# Patient Record
Sex: Female | Born: 1995 | Race: Black or African American | Hispanic: No | Smoking: Never smoker
Health system: Southern US, Community
[De-identification: ages and names within clinical notes are randomized; demographics above are authoritative.]

## PROBLEM LIST (undated history)

## (undated) DIAGNOSIS — D649 Anemia, unspecified: Secondary | ICD-10-CM

## (undated) DIAGNOSIS — T7840XA Allergy, unspecified, initial encounter: Secondary | ICD-10-CM

## (undated) HISTORY — DX: Allergy, unspecified, initial encounter: T78.40XA

## (undated) HISTORY — DX: Anemia, unspecified: D64.9

---
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
## 2017-05-26 ENCOUNTER — Encounter: Payer: Self-pay | Admitting: Family Medicine

## 2017-05-26 ENCOUNTER — Ambulatory Visit: Payer: BC Managed Care – PPO | Admitting: Family Medicine

## 2017-05-26 ENCOUNTER — Other Ambulatory Visit: Payer: Self-pay

## 2017-05-26 ENCOUNTER — Ambulatory Visit (INDEPENDENT_AMBULATORY_CARE_PROVIDER_SITE_OTHER): Payer: BC Managed Care – PPO

## 2017-05-26 VITALS — BP 118/60 | HR 110 | Temp 100.7°F | Resp 16 | Ht 66.0 in | Wt 139.2 lb

## 2017-05-26 DIAGNOSIS — R52 Pain, unspecified: Secondary | ICD-10-CM | POA: Diagnosis not present

## 2017-05-26 DIAGNOSIS — J111 Influenza due to unidentified influenza virus with other respiratory manifestations: Secondary | ICD-10-CM

## 2017-05-26 LAB — POC INFLUENZA A&B (BINAX/QUICKVUE)
Influenza A, POC: POSITIVE — AB
Influenza B, POC: NEGATIVE

## 2017-05-26 MED ORDER — CODEINE POLT-CHLORPHEN POLT ER 14.7-2.8 MG/5ML PO SUER: 10.0000 mL | Freq: Two times a day (BID) | ORAL | 0 refills | Status: DC | PRN

## 2017-05-26 MED ORDER — ALBUTEROL SULFATE (2.5 MG/3ML) 0.083% IN NEBU
2.5000 mg | INHALATION_SOLUTION | Freq: Once | RESPIRATORY_TRACT | Status: AC
  Administered 2017-05-26: 2.5 mg via RESPIRATORY_TRACT

## 2017-05-26 MED ORDER — BALOXAVIR MARBOXIL(40 MG DOSE) 2 X 20 MG PO TBPK: 40.0000 mg | ORAL_TABLET | Freq: Once | ORAL | 0 refills | Status: AC

## 2017-05-26 NOTE — Progress Notes (Signed)
Subjective:    Patient ID: Jacqueline Mueller, female    DOB: 1995/04/13, 22 y.o.   MRN: [id number] Chief Complaint  Patient presents with  . Influenza    HA, body aches, fever, hot/cold chills started Saturday with a sore throat     HPI Jacqueline Mueller is a delightful 22 yo woman here today with 1-2d of sudden and rapid onset acute illness.  She is a Theatre stage managernursing student at [employer] and had her clinicals for today and tomorrow - she showed up but they sent her home.  Sat - 2d ago - she felt ok - just a little scratchy throat.  Then that night the illness hit her hard while she ws sleeping. Throat became much more sore, sweats, chills, myalgias, nasal congestion, wheezing, SHoB, cough, fatigue, HA.  Waking freq throughout the night. No appetite. She knows she is dehydrated and does feel dizzy, presyncopal.  Home treatments tried inc Tylenol for fever, rest, fluids.  Did get flu shot this yr. No h/o asthma, smoking, or any respiratory problems. However, 1 mo ago she was diagnosed w/ bronchitis and was rx'd an inhaler which she still has at home. Has not used.  Past Medical History:  Diagnosis Date  . Allergy   . Anemia    Past Surgical History:  Procedure Laterality Date  . KNEE ARTHROPLASTY Right 2012   No current outpatient medications on file prior to visit.   No current facility-administered medications on file prior to visit.    Allergies no known allergies No family history on file. Social History   Socioeconomic History  . Marital status: Unknown    Spouse name: Not on file  . Number of children: Not on file  . Years of education: Not on file  . Highest education level: Not on file  Social Needs  . Financial resource strain: Not on file  . Food insecurity - worry: Not on file  . Food insecurity - inability: Not on file  . Transportation needs - medical: Not on file  . Transportation needs - non-medical: Not on file  Occupational History  . Not on file  Tobacco Use  . Smoking status: Not  on file  Substance and Sexual Activity  . Alcohol use: Not on file  . Drug use: Not on file  . Sexual activity: Not on file  Other Topics Concern  . Not on file  Social History Narrative  . Not on file   [facility] 05/26/2017  Decreased Interest 0  Down, Depressed, Hopeless 0  PHQ - 2 Score 0     Review of Systems See hpi    Objective:   Physical Exam  Constitutional: She is oriented to person, place, and time. She appears well-developed and well-nourished. No distress.  HENT:  Head: Normocephalic and atraumatic.  Right Ear: Tympanic membrane, external ear and ear canal normal.  Left Ear: Tympanic membrane, external ear and ear canal normal.  Nose: Rhinorrhea present. No mucosal edema.  Mouth/Throat: Uvula is midline, oropharynx is clear and moist and mucous membranes are normal. No oropharyngeal exudate, posterior oropharyngeal edema or posterior oropharyngeal erythema.  Eyes: Conjunctivae are normal. Right eye exhibits no discharge. Left eye exhibits no discharge. No scleral icterus.  Neck: Normal range of motion. Neck supple.  Cardiovascular: Normal rate, regular rhythm, normal heart sounds and intact distal pulses.  Pulmonary/Chest: Effort normal. She has wheezes (exp RUF much worse and RLF and persist in both anterior and  posterior) in the right upper  field and the right lower field.  After albuterol neb, improved air movement, softer end expiratory wheezing throughout all lungs worse in B upper lobes.  Lymphadenopathy:       Head (right side): No submandibular and no tonsillar adenopathy present.       Head (left side): No submandibular and no tonsillar adenopathy present.    She has cervical adenopathy.       Right cervical: Superficial cervical adenopathy present.       Left cervical: Superficial cervical adenopathy present.       Right: No supraclavicular adenopathy present.       Left: No supraclavicular adenopathy present.  Neurological: She is  alert and oriented to person, place, and time.  Skin: Skin is warm and dry. She is not diaphoretic. No erythema.  Psychiatric: She has a normal mood and affect. Her behavior is normal.      BP 118/60   Pulse (!) 110   Temp (!) 100.7 F (38.2 C)   Resp 16   Ht 5\' 6"  (1.676 m)   Wt 139 lb 3.2 oz (63.1 kg)   SpO2 100%   BMI 22.47 kg/m      Results for orders placed or performed in visit on 05/26/17  POC Influenza A&B(BINAX/QUICKVUE)  Result Value Ref Range   Influenza A, POC Positive (A) Negative   Influenza B, POC Negative Negative   Dg Chest 2 View  Result Date: 05/26/2017 CLINICAL DATA:  22 year old female with expiratory wheezes. Flu like symptoms for 2 days. EXAM: CHEST  2 VIEW COMPARISON:  None. FINDINGS: Lung volumes are compatible with good inspiratory effort. Mediastinal contours are within normal limits. Visualized tracheal air column is within normal limits. No pneumothorax, pulmonary edema, pleural effusion or abnormal pulmonary opacity. No osseous abnormality identified. Negative visible bowel gas pattern. IMPRESSION: Negative.  No acute cardiopulmonary abnormality. Electronically Signed   By: Odessa Fleming M.D.   On: 05/26/2017 14:50    Assessment & Plan:   1. Influenza with respiratory manifestation   2. Body aches   Pt did have some improvement with albuterol neb but was still wheezing - poss secondary to recent bronchitis last mo - advised using albuterol inhaler she was rx'd for the bronchitis sched qid and prn. Pulm toileting with breathing exercises.  RTC if [facility], wheezing, dyspnea, etc progress - at high risk for secondary bacterial infxn - pt aware.   Orders Placed This Encounter  Procedures  . DG Chest 2 View    Standing Status:   Future    Number of Occurrences:   1    Standing Expiration Date:   05/26/2018    Order Specific Question:   Reason for Exam (SYMPTOM  OR DIAGNOSIS REQUIRED)    Answer:   RUF exp wheeze. + influenza x 2d, h/o bronchitis 1 mo prev     Order Specific Question:   Is the patient pregnant?    Answer:   No    Order Specific Question:   Preferred imaging location?    Answer:   External  . POC Influenza A&B(BINAX/QUICKVUE)    Meds ordered this encounter  Medications  . Baloxavir Marboxil 40 MG Dose (XOFLUZA) 20 (2) MG TBPK    Sig: Take 40 mg by mouth once for 1 dose.    Dispense:  2 each    Refill:  0  . albuterol (PROVENTIL) (2.5 MG/3ML) 0.083% nebulizer solution 2.5 mg  . Codeine Polt-Chlorphen Polt ER (TUZISTRA XR) 14.7-2.8 MG/5ML SUER  Sig: Take 10 mLs by mouth every 12 (twelve) hours as needed.    Dispense:  120 mL    Refill:  0    Norberto Sorenson, M.D.  Primary Care at [facility] [address] [place], [place] [address] [phone] phone [phone] fax  05/28/17 12:49 AM

## 2017-05-26 NOTE — Patient Instructions (Addendum)
Influenza, Adult Influenza ("the flu") is an infection in the lungs, nose, and throat (respiratory tract). It is caused by a virus. The flu causes many common cold symptoms, as well as a high fever and body aches. It can make you feel very sick. The flu spreads easily from person to person (is contagious). Getting a flu shot (influenza vaccination) every year is the best way to prevent the flu. Follow these instructions at home:  Take over-the-counter and prescription medicines only as told by your doctor.  Use a cool mist humidifier to add moisture (humidity) to the air in your home. This can make it easier to breathe.  Rest as needed.  Drink enough fluid to keep your pee (urine) clear or pale yellow.  Cover your mouth and nose when you cough or sneeze.  Wash your hands with soap and water often, especially after you cough or sneeze. If you cannot use soap and water, use hand sanitizer.  Stay home from work or school as told by your doctor. Unless you are visiting your doctor, try to avoid leaving home until your fever has been gone for 24 hours without the use of medicine.  Keep all follow-up visits as told by your doctor. This is important. How is this prevented?  Getting a yearly (annual) flu shot is the best way to avoid getting the flu. You may get the flu shot in late summer, fall, or winter. Ask your doctor when you should get your flu shot.  Wash your hands often or use hand sanitizer often.  Avoid contact with people who are sick during cold and flu season.  Eat healthy foods.  Drink plenty of fluids.  Get enough sleep.  Exercise regularly. Contact a doctor if:  You get new symptoms.  You have: ? Chest pain. ? Watery poop (diarrhea). ? A fever.  Your cough gets worse.  You start to have more mucus.  You feel sick to your stomach (nauseous).  You throw up (vomit). Get help right away if:  You start to be short of breath or have trouble breathing.  Your  skin or nails turn a bluish color.  You have very bad pain or stiffness in your neck.  You get a sudden headache.  You get sudden pain in your face or ear.  You cannot stop throwing up. This information is not intended to replace advice given to you by your health care provider. Make sure you discuss any questions you have with your health care provider. Document Released: 12/26/2007 Document Revised: 08/24/2015 Document Reviewed: 01/10/2015 Elsevier Interactive Patient Education  2017 [employer]     IF you received an x-ray today, you will receive an invoice from [facility]. Please contact [facility] at [phone] with questions or concerns regarding your invoice.   IF you received labwork today, you will receive an invoice from [facility]. Please contact [facility] at [phone] with questions or concerns regarding your invoice.   Our billing staff will not be able to assist you with questions regarding bills from these companies.  You will be contacted with the lab results as soon as they are available. The fastest way to get your results is to activate your My Chart account. Instructions are located on the last page of this paperwork. If you have not heard from us regarding the results in 2 weeks, please contact this office.      Influenza, Adult Influenza, more commonly known as "the flu," is a viral infection that primarily  affects the respiratory tract. The respiratory tract includes organs that help you breathe, such as the lungs, nose, and throat. The flu causes many common cold symptoms, as well as a high fever and body aches. The flu spreads easily from person to person (is contagious). Getting a flu shot (influenza vaccination) every year is the best way to prevent influenza. What are the causes? Influenza is caused by a virus. You can catch the virus by:  Breathing in droplets from an infected person's cough or sneeze.  Touching something that  was recently contaminated with the virus and then touching your mouth, nose, or eyes.  What increases the risk? The following factors may make you more likely to get the flu:  Not cleaning your hands frequently with soap and water or alcohol-based hand sanitizer.  Having close contact with many people during cold and flu season.  Touching your mouth, eyes, or nose without washing or sanitizing your hands first.  Not drinking enough fluids or not eating a healthy diet.  Not getting enough sleep or exercise.  Being under a high amount of stress.  Not getting a yearly (annual) flu shot.  You may be at a higher risk of complications from the flu, such as a severe lung infection (pneumonia), if you:  Are over the age of 79.  Are pregnant.  Have a weakened disease-fighting system (immune system). You may have a weakened immune system if you: ? Have HIV or AIDS. ? Are undergoing chemotherapy. ? Aretaking medicines that reduce the activity of (suppress) the immune system.  Have a long-term (chronic) illness, such as heart disease, kidney disease, diabetes, or lung disease.  Have a liver disorder.  Are obese.  Have anemia.  What are the signs or symptoms? Symptoms of this condition typically last 4-10 days and may include:  Fever.  Chills.  Headache, body aches, or muscle aches.  Sore throat.  Cough.  Runny or congested nose.  Chest discomfort and cough.  Poor appetite.  Weakness or tiredness (fatigue).  Dizziness.  Nausea or vomiting.  How is this diagnosed? This condition may be diagnosed based on your medical history and a physical exam. Your health care provider may do a nose or throat swab test to confirm the diagnosis. How is this treated? If influenza is detected early, you can be treated with antiviral medicine that can reduce the length of your illness and the severity of your symptoms. This medicine may be given by mouth (orally) or through an IV  tube that is inserted in one of your veins. The goal of treatment is to relieve symptoms by taking care of yourself at home. This may include taking over-the-counter medicines, drinking plenty of fluids, and adding humidity to the air in your home. In some cases, influenza goes away on its own. Severe influenza or complications from influenza may be treated in a hospital. Follow these instructions at home:  Take over-the-counter and prescription medicines only as told by your health care provider.  Use a cool mist humidifier to add humidity to the air in your home. This can make breathing easier.  Rest as needed.  Drink enough fluid to keep your urine clear or pale yellow.  Cover your mouth and nose when you cough or sneeze.  Wash your hands with soap and water often, especially after you cough or sneeze. If soap and water are not available, use hand sanitizer.  Stay home from work or school as told by your health care provider.  Unless you are visiting your health care provider, try to avoid leaving home until your fever has been gone for 24 hours without the use of medicine.  Keep all follow-up visits as told by your health care provider. This is important. How is this prevented?  Getting an annual flu shot is the best way to avoid getting the flu. You may get the flu shot in late summer, fall, or winter. Ask your health care provider when you should get your flu shot.  Wash your hands often or use hand sanitizer often.  Avoid contact with people who are sick during cold and flu season.  Eat a healthy diet, drink plenty of fluids, get enough sleep, and exercise regularly. Contact a health care provider if:  You develop new symptoms.  You have: ? Chest pain. ? Diarrhea. ? A fever.  Your cough gets worse.  You produce more mucus.  You feel nauseous or you vomit. Get help right away if:  You develop shortness of breath or difficulty breathing.  Your skin or nails turn a  bluish color.  You have severe pain or stiffness in your neck.  You develop a sudden headache or sudden pain in your face or ear.  You cannot stop vomiting. This information is not intended to replace advice given to you by your health care provider. Make sure you discuss any questions you have with your health care provider. Document Released: 03/15/2000 Document Revised: 08/24/2015 Document Reviewed: 01/10/2015 Elsevier Interactive Patient Education  2017 Elsevier Inc.  Bronchospasm, Adult Bronchospasm is a tightening of the airways going into the lungs. During an episode, it may be harder to breathe. You may cough, and you may make a whistling sound when you breathe (wheeze). This condition often affects people with asthma. What are the causes? This condition is caused by swelling and irritation in the airways. It can be triggered by:  An infection (common).  Seasonal allergies.  An allergic reaction.  Exercise.  Irritants. These include pollution, cigarette smoke, strong odors, aerosol sprays, and paint fumes.  Weather changes. Winds increase molds and pollens in the air. Cold air may cause swelling.  Stress and emotional upset.  What are the signs or symptoms? Symptoms of this condition include:  Wheezing. If the episode was triggered by an allergy, wheezing may start right away or hours later.  Nighttime coughing.  Frequent or severe coughing with a simple cold.  Chest tightness.  Shortness of breath.  Decreased ability to exercise.  How is this diagnosed? This condition is usually diagnosed with a review of your medical history and a physical exam. Tests, such as lung function tests, are sometimes done to look for other conditions. The need for a chest X-ray depends on where the wheezing occurs and whether it is the first time you have wheezed. How is this treated? This condition may be treated with:  Inhaled medicines. These open up the airways and help you  breathe. They can be taken with an inhaler or a nebulizer device.  Corticosteroid medicines. These may be given for severe bronchospasm, usually when it is associated with asthma.  Avoiding triggers, such as irritants, infection, or allergies.  Follow these instructions at home: Medicines  Take over-the-counter and prescription medicines only as told by your health care provider.  If you need to use an inhaler or nebulizer to take your medicine, ask your health care provider to explain how to use it correctly. If you were given a spacer, always use it  with your inhaler. Lifestyle  Reduce the number of triggers in your home. To do this: ? Change your heating and air conditioning filter at least once a month. ? Limit your use of fireplaces and wood stoves. ? Do not smoke. Do not allow smoking in your home. ? Avoid using perfumes and fragrances. ? Get rid of pests, such as roaches and mice, and their droppings. ? Remove any mold from your home. ? Keep your house clean and dust free. Use unscented cleaning products. ? Replace carpet with wood, tile, or vinyl flooring. Carpet can trap dander and dust. ? Use allergy-proof pillows, mattress covers, and box spring covers. ? Wash bed sheets and blankets every week in hot water. Dry them in a dryer. ? Use blankets that are made of polyester or cotton. ? Wash your hands often. ? Do not allow pets in your bedroom.  Avoid breathing in cold air when you exercise. General instructions  Have a plan for seeking medical care. Know when to call your health care provider and local emergency services, and where to get emergency care.  Stay up to date on your immunizations.  When you have an episode of bronchospasm, stay calm. Try to relax and breathe more slowly.  If you have asthma, make sure you have an asthma action plan.  Keep all follow-up visits as told by your health care provider. This is important. Contact a health care provider  if:  You have muscle aches.  You have chest pain.  The mucus that you cough up (sputum) changes from clear or white to yellow, green, gray, or bloody.  You have a fever.  Your sputum gets thicker. Get help right away if:  Your wheezing and coughing get worse, even after you take your prescribed medicines.  It gets even harder to breathe.  You develop severe chest pain. Summary  Bronchospasm is a tightening of the airways going into the lungs.  During an episode of bronchospasm, you may have a harder time breathing. You may cough and make a whistling sound when you breathe (wheeze).  Avoid exposure to triggers such as smoke, dust, mold, animal dander, and fragrances.  When you have an episode of bronchospasm, stay calm. Try to relax and breathe more slowly. This information is not intended to replace advice given to you by your health care provider. Make sure you discuss any questions you have with your health care provider. Document Released: 03/21/2003 Document Revised: 03/14/2016 Document Reviewed: 03/14/2016 Elsevier Interactive Patient Education  2017 [employer].

## 2017-07-14 ENCOUNTER — Ambulatory Visit (INDEPENDENT_AMBULATORY_CARE_PROVIDER_SITE_OTHER): Payer: BC Managed Care – PPO

## 2017-07-14 ENCOUNTER — Ambulatory Visit: Payer: Self-pay | Admitting: *Deleted

## 2017-07-14 ENCOUNTER — Encounter: Payer: Self-pay | Admitting: Physician Assistant

## 2017-07-14 ENCOUNTER — Ambulatory Visit: Payer: BC Managed Care – PPO | Admitting: Physician Assistant

## 2017-07-14 VITALS — BP 111/86 | HR 104 | Temp 99.0°F | Resp 20

## 2017-07-14 DIAGNOSIS — J22 Unspecified acute lower respiratory infection: Secondary | ICD-10-CM

## 2017-07-14 DIAGNOSIS — R062 Wheezing: Secondary | ICD-10-CM

## 2017-07-14 DIAGNOSIS — R058 Other specified cough: Secondary | ICD-10-CM

## 2017-07-14 DIAGNOSIS — R0602 Shortness of breath: Secondary | ICD-10-CM

## 2017-07-14 DIAGNOSIS — R05 Cough: Secondary | ICD-10-CM

## 2017-07-14 DIAGNOSIS — D509 Iron deficiency anemia, unspecified: Secondary | ICD-10-CM

## 2017-07-14 LAB — POCT CBC
GRANULOCYTE PERCENT: 64.7 % (ref 37–80)
HCT, POC: 31.5 % — AB (ref 37.7–47.9)
Hemoglobin: 9.5 g/dL — AB (ref 12.2–16.2)
Lymph, poc: 2.7 (ref 0.6–3.4)
MCH, POC: 23 pg — AB (ref 27–31.2)
MCHC: 30.2 g/dL — AB (ref 31.8–35.4)
MCV: 76 fL — AB (ref 80–97)
MID (cbc): 0.8 (ref 0–0.9)
MPV: 7.3 fL (ref 0–99.8)
PLATELET COUNT, POC: 376 10*3/uL (ref 142–424)
POC Granulocyte: 6.5 (ref 2–6.9)
POC LYMPH %: 27.4 % (ref 10–50)
POC MID %: 7.9 %M (ref 0–12)
RBC: 4.14 M/uL (ref 4.04–5.48)
RDW, POC: 21.6 %
WBC: 10 10*3/uL (ref 4.6–10.2)

## 2017-07-14 LAB — POC INFLUENZA A&B (BINAX/QUICKVUE)
INFLUENZA A, POC: NEGATIVE
Influenza B, POC: NEGATIVE

## 2017-07-14 MED ORDER — ALBUTEROL SULFATE (2.5 MG/3ML) 0.083% IN NEBU
2.5000 mg | INHALATION_SOLUTION | Freq: Once | RESPIRATORY_TRACT | Status: AC
  Administered 2017-07-14: 2.5 mg via RESPIRATORY_TRACT

## 2017-07-14 MED ORDER — FERROUS SULFATE 324 (65 FE) MG PO TBEC: 1.0000 | DELAYED_RELEASE_TABLET | ORAL | 2 refills | Status: AC

## 2017-07-14 MED ORDER — METHYLPREDNISOLONE SODIUM SUCC 125 MG IJ SOLR
125.0000 mg | Freq: Once | INTRAMUSCULAR | Status: AC
  Administered 2017-07-14: 125 mg via INTRAVENOUS

## 2017-07-14 MED ORDER — AZITHROMYCIN 250 MG PO TABS: ORAL_TABLET | ORAL | 0 refills | Status: DC

## 2017-07-14 MED ORDER — IPRATROPIUM BROMIDE 0.02 % IN SOLN
0.5000 mg | Freq: Once | RESPIRATORY_TRACT | Status: AC
  Administered 2017-07-14: 0.5 mg via RESPIRATORY_TRACT

## 2017-07-14 MED ORDER — SODIUM CHLORIDE 0.9 % IV BOLUS
1000.0000 mL | Freq: Once | INTRAVENOUS | Status: AC
  Administered 2017-07-14: 1000 mL via INTRAVENOUS

## 2017-07-14 MED ORDER — PREDNISONE 20 MG PO TABS: ORAL_TABLET | ORAL | 0 refills | Status: DC

## 2017-07-14 MED ORDER — GUAIFENESIN ER 1200 MG PO TB12: 1.0000 | ORAL_TABLET | Freq: Two times a day (BID) | ORAL | 1 refills | Status: DC | PRN

## 2017-07-14 NOTE — Telephone Encounter (Signed)
Pt reports mild SOB x 3 days, productive cough for "yellowish sputum" sore throat, congestion and dizziness. Worsening symptoms this AM. Audible wheezing present. States dizziness is positional, worse sitting to standing. Seen by Dr. Clelia CroftShaw 05/26/17 for similar symptoms, Pos.type A flu. . States using albuterol inhaler, minimally effective. Denies fever.Reports chest "tightness" does not radiate. SOB worsens with exertion; NT can hear wheezing.  Directed to UC. Care advise given per protocol.  Reason for Disposition . [1] MILD difficulty breathing (e.g., minimal/no SOB at rest, SOB with walking, pulse <100) AND [2] NEW-onset or WORSE than normal  Answer Assessment - Initial Assessment Questions 1. RESPIRATORY STATUS: "Describe your breathing?" (e.g., wheezing, shortness of breath, unable to speak, severe coughing)      Wheezing is audible 2. ONSET: "When did this breathing problem begin?"      3 days ago 3. PATTERN "Does the difficult breathing come and go, or has it been constant since it started?"      Constant, worse with exertion 4. SEVERITY: "How bad is your breathing?" (e.g., mild, moderate, severe)    - MILD: No SOB at rest, mild SOB with walking, speaks normally in sentences, can lay down, no retractions, pulse < 100.    - MODERATE: SOB at rest, SOB with minimal exertion and prefers to sit, cannot lie down flat, speaks in phrases, mild retractions, audible wheezing, pulse 100-120.    - SEVERE: Very SOB at rest, speaks in single words, struggling to breathe, sitting hunched forward, retractions, pulse > 120      Mild to moderate with exertion 5. RECURRENT SYMPTOM: "Have you had difficulty breathing before?" If so, ask: "When was the last time?" and "What happened that time?"      05/26/17, bronchitis, flu 6. CARDIAC HISTORY: "Do you have any history of heart disease?" (e.g., heart attack, angina, bypass surgery, angioplasty)      no 7. LUNG HISTORY: "Do you have any history of lung disease?"   (e.g., pulmonary embolus, asthma, emphysema)     no 8. CAUSE: "What do you think is causing the breathing problem?"      Not sure 9. OTHER SYMPTOMS: "Do you have any other symptoms? (e.g., dizziness, runny nose, cough, chest pain, fever)     Congestion, dizziness, sore throat, productive cough, chest tightness. 10. PREGNANCY: "Is there any chance you are pregnant?" "When was your last menstrual period?"       No 11. TRAVEL: "Have you traveled out of the country in the last month?" (e.g., travel history, exposures)       no  Protocols used: BREATHING DIFFICULTY-A-AH

## 2017-07-14 NOTE — Progress Notes (Signed)
PRIMARY CARE AT [facility] [address] [place] [address] [phone]  Date:  07/14/2017   Name:  Jacqueline Mueller   DOB:  April 11, 1995   MRN:  [id number]  PCP:  Patient, No Pcp Per    History of Present Illness:  Jacqueline Mueller is a 22 y.o. female patient who presents to PCP with  Chief Complaint  Patient presents with  . Shortness of Breath    expiratory and inspiartory wheezing x2 days with flu like symptoms beginning a week ago     Patient is here for cc of 2 days of fever and chills.  Body aches.  Developed coughing and wheezing. She has had minimal nasal congestion.   She has not taken her temperature. 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 She is not a smoker.  No history of asthma.   She has had some dizziness and trouble breathing.   Hx of bronchitis with use of inhaler. Following blood work, does claim history of anemia secondary to menses.  Generally takes iron but stopped due to gi symptoms.    Results for orders placed or performed in visit on 07/14/17  POC Influenza A&B(BINAX/QUICKVUE)  Result Value Ref Range   Influenza A, POC Negative Negative   Influenza B, POC Negative Negative  POCT CBC  Result Value Ref Range   WBC 10.0 4.6 - 10.2 K/uL   Lymph, poc 2.7 0.6 - 3.4   POC LYMPH PERCENT 27.4 10 - 50 %L   MID (cbc) 0.8 0 - 0.9   POC MID % 7.9 0 - 12 %M   POC Granulocyte 6.5 2 - 6.9   Granulocyte percent 64.7 37 - 80 %G   RBC 4.14 4.04 - 5.48 M/uL   Hemoglobin 9.5 (A) 12.2 - 16.2 g/dL   HCT, POC 57.8 (A) 46.9 - 47.9 %   MCV 76.0 (A) 80 - 97 fL   MCH, POC 23.0 (A) 27 - 31.2 pg   MCHC 30.2 (A) 31.8 - 35.4 g/dL   RDW, POC 62.9 %   Platelet Count, POC 376 142 - 424 K/uL   MPV 7.3 0 - 99.8 fL    There are no active problems to display for this patient.   Past Medical History:  Diagnosis Date  . Allergy   . Anemia     Past Surgical History:  Procedure Laterality Date  . KNEE ARTHROPLASTY Right 2012    Social History   Tobacco Use  .  Smoking status: Never Smoker  . Smokeless tobacco: Never Used  Substance Use Topics  . Alcohol use: Not on file  . Drug use: Not on file    History reviewed. No pertinent family history.  No Known Allergies  Medication list has been reviewed and updated.  Current Outpatient Medications on File Prior to Visit  Medication Sig Dispense Refill  . Codeine Polt-Chlorphen Polt ER (TUZISTRA XR) 14.7-2.8 MG/5ML SUER Take 10 mLs by mouth every 12 (twelve) hours as needed. (Patient not taking: Reported on 07/14/2017) 120 mL 0   No current facility-administered medications on file prior to visit.     ROS ROS otherwise unremarkable unless listed above.  Physical Examination: BP 111/86   Pulse (!) 104   Temp 99 F (37.2 C) (Oral)   Resp 20  Ideal Body Weight:    Physical Exam  Constitutional: She is oriented to person, place, and time. She appears well-developed and well-nourished. No distress.  HENT:  Head: Normocephalic and atraumatic.  Right Ear:  Tympanic membrane, external ear and ear canal normal.  Left Ear: Tympanic membrane, external ear and ear canal normal.  Nose: Mucosal edema and rhinorrhea present. Right sinus exhibits no maxillary sinus tenderness and no frontal sinus tenderness. Left sinus exhibits no maxillary sinus tenderness and no frontal sinus tenderness.  Mouth/Throat: No uvula swelling. No oropharyngeal exudate, posterior oropharyngeal edema or posterior oropharyngeal erythema.  Eyes: Pupils are equal, round, and reactive to light. Conjunctivae and EOM are normal.  Cardiovascular: Normal rate and regular rhythm. Exam reveals no gallop, no distant heart sounds and no friction rub.  No murmur heard. Pulmonary/Chest: No accessory muscle usage. No respiratory distress. She has decreased breath sounds. She has wheezes. She has no rhonchi.  Lymphadenopathy:       Head (right side): No submandibular, no tonsillar, no preauricular and no posterior auricular adenopathy  present.       Head (left side): No submandibular, no tonsillar, no preauricular and no posterior auricular adenopathy present.  Neurological: She is alert and oriented to person, place, and time.  Skin: She is not diaphoretic.  Psychiatric: She has a normal mood and affect. Her behavior is normal.   Results for orders placed or performed in visit on 07/14/17  Iron, TIBC and Ferritin Panel  Result Value Ref Range   Total Iron Binding Capacity 443 250 - 450 ug/dL   UIBC 782423 956131 - 213425 ug/dL   Iron 20 (L) 27 - 086159 ug/dL   Iron Saturation 5 (LL) 15 - 55 %   Ferritin 10 (L) 15 - 150 ng/mL  POC Influenza A&B(BINAX/QUICKVUE)  Result Value Ref Range   Influenza A, POC Negative Negative   Influenza B, POC Negative Negative  POCT CBC  Result Value Ref Range   WBC 10.0 4.6 - 10.2 K/uL   Lymph, poc 2.7 0.6 - 3.4   POC LYMPH PERCENT 27.4 10 - 50 %L   MID (cbc) 0.8 0 - 0.9   POC MID % 7.9 0 - 12 %M   POC Granulocyte 6.5 2 - 6.9   Granulocyte percent 64.7 37 - 80 %G   RBC 4.14 4.04 - 5.48 M/uL   Hemoglobin 9.5 (A) 12.2 - 16.2 g/dL   HCT, POC 57.831.5 (A) 46.937.7 - 47.9 %   MCV 76.0 (A) 80 - 97 fL   MCH, POC 23.0 (A) 27 - 31.2 pg   MCHC 30.2 (A) 31.8 - 35.4 g/dL   RDW, POC 62.921.6 %   Platelet Count, POC 376 142 - 424 K/uL   MPV 7.3 0 - 99.8 fL      Assessment and Plan: Jacqueline Mueller is a 22 y.o. female who is here today for cc of  Chief Complaint  Patient presents with  . Shortness of Breath    expiratory and inspiartory wheezing x2 days with flu like symptoms beginning a week ago  after 2 nebulizing treatment, some improvement of symptoms.   Given solumedrol Advised to start prednisone taper the next day.   Advised of alarming symptoms to warrant an immediate return or to the ED Advised to follow up in 3 days with McVey, pa-c Also advised to follow up additionally in 2 weeks to recheck cbc.  Lower respiratory infection (e.g., bronchitis, pneumonia, pneumonitis, pulmonitis) - Plan:  azithromycin (ZITHROMAX) 250 MG tablet, predniSONE (DELTASONE) 20 MG tablet, Guaifenesin (MUCINEX MAXIMUM STRENGTH) 1200 MG TB12  Shortness of breath - Plan: albuterol (PROVENTIL) (2.5 MG/3ML) 0.083% nebulizer solution 2.5 mg, ipratropium (ATROVENT) nebulizer solution 0.5 mg, POC  Influenza A&B(BINAX/QUICKVUE), DG Chest 2 View, albuterol (PROVENTIL) (2.5 MG/3ML) 0.083% nebulizer solution 2.5 mg, POCT CBC, sodium chloride 0.9 % bolus 1,000 mL, methylPREDNISolone sodium succinate (SOLU-MEDROL) 125 mg/2 mL injection 125 mg, azithromycin (ZITHROMAX) 250 MG tablet, predniSONE (DELTASONE) 20 MG tablet, Guaifenesin (MUCINEX MAXIMUM STRENGTH) 1200 MG TB12  Wheezing - Plan: DG Chest 2 View, albuterol (PROVENTIL) (2.5 MG/3ML) 0.083% nebulizer solution 2.5 mg, POCT CBC, sodium chloride 0.9 % bolus 1,000 mL, methylPREDNISolone sodium succinate (SOLU-MEDROL) 125 mg/2 mL injection 125 mg, azithromycin (ZITHROMAX) 250 MG tablet, predniSONE (DELTASONE) 20 MG tablet, Guaifenesin (MUCINEX MAXIMUM STRENGTH) 1200 MG TB12  Cough productive of yellow sputum - Plan: DG Chest 2 View, albuterol (PROVENTIL) (2.5 MG/3ML) 0.083% nebulizer solution 2.5 mg, POCT CBC, sodium chloride 0.9 % bolus 1,000 mL, methylPREDNISolone sodium succinate (SOLU-MEDROL) 125 mg/2 mL injection 125 mg, azithromycin (ZITHROMAX) 250 MG tablet, predniSONE (DELTASONE) 20 MG tablet, Guaifenesin (MUCINEX MAXIMUM STRENGTH) 1200 MG TB12  Iron deficiency anemia, unspecified iron deficiency anemia type - Plan: ferrous sulfate 324 (65 Fe) MG TBEC, Iron, TIBC and Ferritin Panel, CANCELED: IBC Panel([facility]), CANCELED: Ferritin, CANCELED: IBC panel  Trena Platt, PA-C [facility] 4/18/201911:52 AM

## 2017-07-14 NOTE — Patient Instructions (Addendum)
Do not take the prednisone until tomorrow.  You are able to use your albuterol every 6 hours, for the next 24 hours routinely.   I would like you to start the antibiotic today.   Bronchospasm, Adult Bronchospasm is a tightening of the airways going into the lungs. During an episode, it may be harder to breathe. You may cough, and you may make a whistling sound when you breathe (wheeze). This condition often affects people with asthma. What are the causes? This condition is caused by swelling and irritation in the airways. It can be triggered by:  An infection (common).  Seasonal allergies.  An allergic reaction.  Exercise.  Irritants. These include pollution, cigarette smoke, strong odors, aerosol sprays, and paint fumes.  Weather changes. Winds increase molds and pollens in the air. Cold air may cause swelling.  Stress and emotional upset.  What are the signs or symptoms? Symptoms of this condition include:  Wheezing. If the episode was triggered by an allergy, wheezing may start right away or hours later.  Nighttime coughing.  Frequent or severe coughing with a simple cold.  Chest tightness.  Shortness of breath.  Decreased ability to exercise.  How is this diagnosed? This condition is usually diagnosed with a review of your medical history and a physical exam. Tests, such as lung function tests, are sometimes done to look for other conditions. The need for a chest X-ray depends on where the wheezing occurs and whether it is the first time you have wheezed. How is this treated? This condition may be treated with:  Inhaled medicines. These open up the airways and help you breathe. They can be taken with an inhaler or a nebulizer device.  Corticosteroid medicines. These may be given for severe bronchospasm, usually when it is associated with asthma.  Avoiding triggers, such as irritants, infection, or allergies.  Follow these instructions at home: Medicines  Take  over-the-counter and prescription medicines only as told by your health care provider.  If you need to use an inhaler or nebulizer to take your medicine, ask your health care provider to explain how to use it correctly. If you were given a spacer, always use it with your inhaler. Lifestyle  Reduce the number of triggers in your home. To do this: ? Change your heating and air conditioning filter at least once a month. ? Limit your use of fireplaces and wood stoves. ? Do not smoke. Do not allow smoking in your home. ? Avoid using perfumes and fragrances. ? Get rid of pests, such as roaches and mice, and their droppings. ? Remove any mold from your home. ? Keep your house clean and dust free. Use unscented cleaning products. ? Replace carpet with wood, tile, or vinyl flooring. Carpet can trap dander and dust. ? Use allergy-proof pillows, mattress covers, and box spring covers. ? Wash bed sheets and blankets every week in hot water. Dry them in a dryer. ? Use blankets that are made of polyester or cotton. ? Wash your hands often. ? Do not allow pets in your bedroom.  Avoid breathing in cold air when you exercise. General instructions  Have a plan for seeking medical care. Know when to call your health care provider and local emergency services, and where to get emergency care.  Stay up to date on your immunizations.  When you have an episode of bronchospasm, stay calm. Try to relax and breathe more slowly.  If you have asthma, make sure you have an asthma action  plan.  Keep all follow-up visits as told by your health care provider. This is important. Contact a health care provider if:  You have muscle aches.  You have chest pain.  The mucus that you cough up (sputum) changes from clear or white to yellow, green, gray, or bloody.  You have a fever.  Your sputum gets thicker. Get help right away if:  Your wheezing and coughing get worse, even after you take your prescribed  medicines.  It gets even harder to breathe.  You develop severe chest pain. Summary  Bronchospasm is a tightening of the airways going into the lungs.  During an episode of bronchospasm, you may have a harder time breathing. You may cough and make a whistling sound when you breathe (wheeze).  Avoid exposure to triggers such as smoke, dust, mold, animal dander, and fragrances.  When you have an episode of bronchospasm, stay calm. Try to relax and breathe more slowly. This information is not intended to replace advice given to you by your health care provider. Make sure you discuss any questions you have with your health care provider. Document Released: 03/21/2003 Document Revised: 03/14/2016 Document Reviewed: 03/14/2016 Elsevier Interactive Patient Education  2017 [employer]     IF you received an x-ray today, you will receive an invoice from [facility]. Please contact [facility] at [phone] with questions or concerns regarding your invoice.   IF you received labwork today, you will receive an invoice from [facility]. Please contact [facility] at [phone] with questions or concerns regarding your invoice.   Our billing staff will not be able to assist you with questions regarding bills from these companies.  You will be contacted with the lab results as soon as they are available. The fastest way to get your results is to activate your My Chart account. Instructions are located on the last page of this paperwork. If you have not heard from us regarding the results in 2 weeks, please contact this office.

## 2017-07-15 LAB — IRON,TIBC AND FERRITIN PANEL
FERRITIN: 10 ng/mL — AB (ref 15–150)
IRON: 20 ug/dL — AB (ref 27–159)
Iron Saturation: 5 % — CL (ref 15–55)
Total Iron Binding Capacity: 443 ug/dL (ref 250–450)
UIBC: 423 ug/dL (ref 131–425)

## 2017-07-16 ENCOUNTER — Other Ambulatory Visit: Payer: Self-pay

## 2017-07-16 ENCOUNTER — Ambulatory Visit (INDEPENDENT_AMBULATORY_CARE_PROVIDER_SITE_OTHER): Payer: BC Managed Care – PPO | Admitting: Physician Assistant

## 2017-07-16 ENCOUNTER — Encounter: Payer: Self-pay | Admitting: Physician Assistant

## 2017-07-16 VITALS — BP 104/60 | HR 87 | Temp 97.9°F | Resp 16 | Ht 67.0 in | Wt 145.8 lb

## 2017-07-16 DIAGNOSIS — R062 Wheezing: Secondary | ICD-10-CM

## 2017-07-16 MED ORDER — ALBUTEROL SULFATE (2.5 MG/3ML) 0.083% IN NEBU
2.5000 mg | INHALATION_SOLUTION | Freq: Once | RESPIRATORY_TRACT | Status: AC
  Administered 2017-07-16: 2.5 mg via RESPIRATORY_TRACT

## 2017-07-16 MED ORDER — IPRATROPIUM BROMIDE 0.02 % IN SOLN
0.5000 mg | Freq: Once | RESPIRATORY_TRACT | Status: AC
  Administered 2017-07-16: 0.5 mg via RESPIRATORY_TRACT

## 2017-07-16 NOTE — Patient Instructions (Signed)
     IF you received an x-ray today, you will receive an invoice from [facility]. Please contact [facility] at [phone] with questions or concerns regarding your invoice.   IF you received labwork today, you will receive an invoice from [facility]. Please contact [facility] at [phone] with questions or concerns regarding your invoice.   Our billing staff will not be able to assist you with questions regarding bills from these companies.  You will be contacted with the lab results as soon as they are available. The fastest way to get your results is to activate your My Chart account. Instructions are located on the last page of this paperwork. If you have not heard from us regarding the results in 2 weeks, please contact this office.     

## 2017-07-16 NOTE — Progress Notes (Signed)
Jacqueline Mueller  MRN: [id number] DOB: [birth year]  PCP: Patient, No Pcp Per  Subjective:  Pt is a 22 year old female who presents to clinic for f/u shob.  She was here 2 days ago for wheezing. Negative flu, WBC count is wnl. Chest x-ray was negative. She was treated with nebulizer breathing treatment.  She started prednisone yesterday. "I've been feeling about 75% better." At night her symptoms are worse - in the middle of the night she wakes up with a barking cough. She is sleeping on a few pillows to stay elevated.  She is using Albuterol about 3-4 x/day. She is also taking Mucinex as prescribed. She is staying well hydrated. Endorses shob and chest tightness with activities like walking up stairs. No shob at rest.  No h/o asthma.  She has been sick more in the past 4 months than she has in the past 4 years.  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   Review of Systems  Constitutional: Negative for diaphoresis and fatigue.  Respiratory: Positive for cough, chest tightness, shortness of breath and wheezing.   Cardiovascular: Negative for chest pain and palpitations.  Psychiatric/Behavioral: Positive for sleep disturbance.    There are no active problems to display for this patient.   Current Outpatient Medications on File Prior to Visit  Medication Sig Dispense Refill  . azithromycin (ZITHROMAX) 250 MG tablet Take 2 tabs PO x 1 dose, then 1 tab PO QD x 4 days 6 tablet 0  . ferrous sulfate 324 (65 Fe) MG TBEC Take 1 tablet (325 mg total) by mouth every other day. 30 tablet 2  . Guaifenesin (MUCINEX MAXIMUM STRENGTH) 1200 MG TB12 Take 1 tablet (1,200 mg total) by mouth every 12 (twelve) hours as needed. 14 tablet 1  . predniSONE (DELTASONE) 20 MG tablet Take 3 PO QAM x2days, 2 PO QAM x2days, 1 PO QAM x3days 13 tablet 0   No current facility-administered medications on file prior to visit.     No Known Allergies   Objective:  BP 104/60   Pulse 87   Temp 97.9 F (36.6 C) (Oral)    Resp 16   Ht 5\' 7"  (1.702 m)   Wt 145 lb 12.8 oz (66.1 kg)   LMP 06/18/2017   SpO2 99%   BMI 22.84 kg/m   Physical Exam  Constitutional: She is oriented to person, place, and time. No distress.  Cardiovascular: Normal rate, regular rhythm and normal heart sounds.  Pulmonary/Chest: Effort normal. No accessory muscle usage. No respiratory distress. She has wheezes in the right upper field, the right middle field, the right lower field, the left upper field, the left middle field and the left lower field.  Wheezing nearly resolved following breathing treatment.   Neurological: She is alert and oriented to person, place, and time.  Skin: Skin is warm and dry.  Psychiatric: Judgment normal.  Vitals reviewed.  Office Spirometry Results: Peak Flow: 350 L/min(after Tx #1-450, #2- 450 and #3 -460)  Assessment and Plan :  1. Wheezing - Peak flow meter - albuterol (PROVENTIL) (2.5 MG/3ML) 0.083% nebulizer solution 2.5 mg - ipratropium (ATROVENT) nebulizer solution 0.5 mg - Pt presents for f/u wheezing. She was here 2 days ago for shob and wheezing- Negative flu, WBC count wnl, Chest x-ray negative. She was treated with nebulizer breathing treatment x 2, prednisone taper and albuterol inhaler. Today she feels 75% better. Wheezing heard throughout all lung fields on PE, improved with duoneb - she endorses  improvement of chest tightness following treatment. Advised pt con't prednisone taper, albuterol, mucinex and hydration. She will RTC in 3-4 days if her symptoms worsen.   Marco CollieWhitney Flora Parks, PA-C  Primary Care at [facility] 07/16/2017 12:29 PM

## 2017-07-21 ENCOUNTER — Telehealth: Payer: Self-pay | Admitting: Physician Assistant

## 2017-07-21 NOTE — Telephone Encounter (Signed)
Called in and was given lab results from NelsonStephanie English from 07/14/17. Please advise that she should return in 2 weeks, to assess her hemoglobin again.    This can be done at an annual physical.  I scheduled her with Marco CollieWhitney McVey, PA-C since Trena PlattStephanie English is leaving the practice.  Appt on 07/30/17 at 11:00.

## 2017-07-30 ENCOUNTER — Ambulatory Visit (INDEPENDENT_AMBULATORY_CARE_PROVIDER_SITE_OTHER): Payer: BC Managed Care – PPO | Admitting: Physician Assistant

## 2017-07-30 ENCOUNTER — Other Ambulatory Visit: Payer: Self-pay

## 2017-07-30 ENCOUNTER — Encounter: Payer: Self-pay | Admitting: Physician Assistant

## 2017-07-30 VITALS — BP 106/74 | HR 90 | Temp 98.4°F | Resp 16 | Ht 66.5 in | Wt 144.6 lb

## 2017-07-30 DIAGNOSIS — Z13220 Encounter for screening for lipoid disorders: Secondary | ICD-10-CM

## 2017-07-30 DIAGNOSIS — Z0000 Encounter for general adult medical examination without abnormal findings: Secondary | ICD-10-CM | POA: Diagnosis not present

## 2017-07-30 DIAGNOSIS — N920 Excessive and frequent menstruation with regular cycle: Secondary | ICD-10-CM

## 2017-07-30 DIAGNOSIS — Z1329 Encounter for screening for other suspected endocrine disorder: Secondary | ICD-10-CM | POA: Diagnosis not present

## 2017-07-30 DIAGNOSIS — D509 Iron deficiency anemia, unspecified: Secondary | ICD-10-CM | POA: Diagnosis not present

## 2017-07-30 LAB — POCT CBC
Granulocyte percent: 34.3 %G — AB (ref 37–80)
HCT, POC: 29.1 % — AB (ref 37.7–47.9)
Hemoglobin: 8.8 g/dL — AB (ref 12.2–16.2)
Lymph, poc: 2.6 (ref 0.6–3.4)
MCH, POC: 22.8 pg — AB (ref 27–31.2)
MCHC: 30.1 g/dL — AB (ref 31.8–35.4)
MCV: 75.9 fL — AB (ref 80–97)
MID (cbc): 0.3 (ref 0–0.9)
MPV: 7 fL (ref 0–99.8)
POC Granulocyte: 1.5 — AB (ref 2–6.9)
POC LYMPH PERCENT: 59.8 %L — AB (ref 10–50)
POC MID %: 5.9 % (ref 0–12)
Platelet Count, POC: 585 10*3/uL — AB (ref 142–424)
RBC: 3.84 M/uL — AB (ref 4.04–5.48)
RDW, POC: 21.3 %
WBC: 4.4 10*3/uL — AB (ref 4.6–10.2)

## 2017-07-30 LAB — CMP14+EGFR
ALT: 11 IU/L (ref 0–32)
AST: 16 IU/L (ref 0–40)
Albumin/Globulin Ratio: 1.6 (ref 1.2–2.2)
Albumin: 4.1 g/dL (ref 3.5–5.5)
Alkaline Phosphatase: 34 IU/L — ABNORMAL LOW (ref 39–117)
BUN/Creatinine Ratio: 12 (ref 9–23)
BUN: 8 mg/dL (ref 6–20)
Bilirubin Total: 0.2 mg/dL (ref 0.0–1.2)
CO2: 20 mmol/L (ref 20–29)
Calcium: 9.3 mg/dL (ref 8.7–10.2)
Chloride: 106 mmol/L (ref 96–106)
Creatinine, Ser: 0.69 mg/dL (ref 0.57–1.00)
GFR calc Af Amer: 144 mL/min/{1.73_m2} (ref >59)
GFR calc non Af Amer: 125 mL/min/{1.73_m2} (ref >59)
Globulin, Total: 2.5 g/dL (ref 1.5–4.5)
Glucose: 76 mg/dL (ref 65–99)
Potassium: 4.4 mmol/L (ref 3.5–5.2)
Sodium: 139 mmol/L (ref 134–144)
Total Protein: 6.6 g/dL (ref 6.0–8.5)

## 2017-07-30 LAB — POCT URINALYSIS DIP (MANUAL ENTRY)
Bilirubin, UA: NEGATIVE
Blood, UA: NEGATIVE
Glucose, UA: NEGATIVE mg/dL
Ketones, POC UA: NEGATIVE mg/dL
Leukocytes, UA: NEGATIVE
Nitrite, UA: NEGATIVE
Protein Ur, POC: NEGATIVE mg/dL
Spec Grav, UA: 1.025 (ref 1.010–1.025)
Urobilinogen, UA: 0.2 U/dL
pH, UA: 5.5 (ref 5.0–8.0)

## 2017-07-30 LAB — LIPID PANEL
Chol/HDL Ratio: 2.3 ratio (ref 0.0–4.4)
Cholesterol, Total: 186 mg/dL (ref 100–199)
HDL: 80 mg/dL (ref >39)
LDL Calculated: 100 mg/dL — ABNORMAL HIGH (ref 0–99)
Triglycerides: 29 mg/dL (ref 0–149)
VLDL Cholesterol Cal: 6 mg/dL (ref 5–40)

## 2017-07-30 MED ORDER — LEVONORGESTREL-ETHINYL ESTRAD 0.1-20 MG-MCG PO TABS: 1.0000 | ORAL_TABLET | Freq: Every day | ORAL | 11 refills | Status: AC

## 2017-07-30 NOTE — Progress Notes (Signed)
Primary Care at [place], [place] [address] [phone]  Date:  07/30/2017   Name:  Jacqueline Mueller   DOB:  Mar 18, 1996   MRN:  [id number]  PCP:  Patient, No Pcp Per    Chief Complaint: Annual Exam (no pap)   History of Present Illness:  This is a 22 y.o. female with PMH iron deficiency anemia, scoliosis who is presenting for CPE. 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  Anemia - started Ferrous sulfate 324 mg every other day. Started on 4/15. Prior to this she took iron supplements in 2016.  She has h/o heavy menstrual periods.   Complaints: sciatic pain. Right side. Has gotten worse over the past year. Occasionally radiates down right leg. Bothers her while working. She has had a steroid injection in the past. She has been to physical therapy - this helped some.  LMP: heavy periods. Changes super plus tampon every 2-3 hours on the first 2 days.  Contraception: none. Has never been on them.  Last pap: never. declines today.  Sexual history: sex with women only. She has had sex with men several years ago.  Immunizations: utd Dentist: has not been  Eye: glasses, 20/20.  Diet/Exercise: healthy. No exercise.  Fam hx: family history includes Cancer in her father, maternal grandmother, paternal grandfather, and paternal grandmother; Diabetes in her father and mother; Heart disease in her mother; Hyperlipidemia in her paternal grandfather; Hypertension in her maternal grandmother and mother; Stroke in her father and maternal grandmother.  Tobacco/alcohol/substance use: never smoker, no drug use, social drinker - once every three months.    Review of Systems:  Review of Systems  Constitutional: Negative for chills, diaphoresis, fatigue and fever.  HENT: Negative for congestion, postnasal drip, rhinorrhea, sinus pressure, sneezing and sore throat.   Respiratory: Negative for cough, chest tightness, shortness of breath and wheezing.   Cardiovascular: Negative for chest  pain and palpitations.  Gastrointestinal: Negative for abdominal pain, diarrhea, nausea and vomiting.  Genitourinary: Positive for menstrual problem (heavy periods).  Musculoskeletal: Positive for back pain.  Neurological: Negative for weakness, light-headedness and headaches.    There are no active problems to display for this patient.   Prior to Admission medications   Medication Sig Start Date End Date Taking? Authorizing Provider  ferrous sulfate 324 (65 Fe) MG TBEC Take 1 tablet (325 mg total) by mouth every other day. 07/14/17  Yes English, Stephanie D, PA  Guaifenesin (MUCINEX MAXIMUM STRENGTH) 1200 MG TB12 Take 1 tablet (1,200 mg total) by mouth every 12 (twelve) hours as needed. Patient not taking: Reported on 07/30/2017 07/14/17   Ivar Drape D, PA    No Known Allergies  Past Surgical History:  Procedure Laterality Date  . KNEE ARTHROPLASTY Right 2012    Social History   Tobacco Use  . Smoking status: Never Smoker  . Smokeless tobacco: Never Used  Substance Use Topics  . Alcohol use: Not on file  . Drug use: Not on file    Family History  Problem Relation Age of Onset  . Diabetes Mother   . Heart disease Mother   . Hypertension Mother   . Cancer Father   . Diabetes Father   . Stroke Father   . Cancer Maternal Grandmother   . Hypertension Maternal Grandmother   . Stroke Maternal Grandmother   . Cancer Paternal Grandmother   . Cancer Paternal Grandfather   . Hyperlipidemia Paternal Grandfather     Medication list has been  reviewed and updated.  Physical Examination:  Physical Exam  Constitutional: She is oriented to person, place, and time. No distress.  Cardiovascular: Normal rate, regular rhythm and normal heart sounds.  Musculoskeletal:       Lumbar back: She exhibits tenderness (low right back). She exhibits no bony tenderness.  Neurological: She is alert and oriented to person, place, and time.  Skin: Skin is warm and dry.  Psychiatric:  Judgment normal.  Vitals reviewed.   BP 106/74   Pulse 90   Temp 98.4 F (36.9 C) (Oral)   Resp 16   Ht 5' 6.5" (1.689 m)   Wt 144 lb 9.6 oz (65.6 kg)   LMP 07/18/2017   SpO2 99%   BMI 22.99 kg/m   Lab Results  Component Value Date   WBC 10.0 07/14/2017   HGB 9.5 (A) 07/14/2017   HCT 31.5 (A) 07/14/2017   MCV 76.0 (A) 07/14/2017    Assessment and Plan: 1. Annual physical exam - pt presents for annual exam. She declines PAP today - discussed PAP information and encouraged her to consider this in the future. Recheck CBC for iron deficiency anemia. Plan to con't iron supplements and recheck in 3 months. Will start OCPs for menorrhagia. Routine labs are pending. Will have her RTC sooner for recheck CBC.  2. Iron deficiency anemia, unspecified iron deficiency anemia type - POCT CBC  3. Menorrhagia with regular cycle - levonorgestrel-ethinyl estradiol (AVIANE) 0.1-20 MG-MCG tablet; Take 1 tablet by mouth daily.  Dispense: 1 Package; Refill: 11  4. Screening for endocrine disorder - CMP14+EGFR - POCT urinalysis dipstick  5. Screening, lipid - Lipid panel   Mercer Pod, PA-C  Primary Care at [place] 07/30/2017 11:21 AM

## 2017-07-30 NOTE — Patient Instructions (Addendum)
Make an appointment visit your dentist for a routine cleaning.  Start taking Avian birth control every day around the same time every day. This will (hopefully) help correct your heavy menstrual periods.  Continue taking your iron supplement. Come back and see me for recheck in 3 months.  Come back and see me if you would like to try some dry needling (see below for more details). Be sure you are well hydrated prior to this appointment.   Pap Test Why am I having this test? A pap test is sometimes called a pap smear. It is a screening test that is used to check for signs of cancer of the vagina, cervix, and uterus. The test can also identify the presence of infection or precancerous changes. Your health care provider will likely recommend you have this test done on a regular basis. This test may be done:  Every 3 years, starting at age 20.  Every 5 years, in combination with testing for the presence of human papillomavirus (HPV).  More or less often depending on other medical conditions.  What kind of sample is taken? Using a small cotton swab, plastic spatula, or brush, your health care provider will collect a sample of cells from the surface of your cervix. Your cervix is the opening to your uterus, also called a womb. Secretions from the cervix and vagina may also be collected. How do I prepare for this test?  Be aware of where you are in your menstrual cycle. You may be asked to reschedule the test if you are menstruating on the day of the test.  You may need to reschedule if you have a known vaginal infection on the day of the test.  You may be asked to avoid douching or taking a bath the day before or the day of the test.  Some medicines can cause abnormal test results, such as digitalis and tetracycline. Talk with your health care provider before your test if you take one of these medicines. What do the results mean? Abnormal test results may indicate a number of health  conditions. These may include:  Cancer. Although pap test results cannot be used to diagnose cancer of the cervix, vagina, or uterus, they may suggest the possibility of cancer. Further tests would be required to determine if cancer is present.  Sexually transmitted disease.  Fungal infection.  Parasite infection.  Herpes infection.  A condition causing or contributing to infertility.   Trigger Point Dry Needling   What is Trigger Point Dry Needling (DN)?   1. DN is a physical therapy technique used to treat muscle pain and dysfunction.  Specifically, DN helps deactivate muscle trigger points (Muscle Knots).   2. A thin filiform needle is used to penetrate the skin and stimulate the underlying trigger point.  The goal is for a local twitch response (LTR) to occur and for the trigger point to relax.  No medication of any kind is injected during the procedure.   What Does Trigger Point Dry Needling Feel Like?   1. The procedures feels different for each individual patient.   Some patients report that they do not actually feel the needle enter the skin and overall the process is not painful.  Very mild bleeding may occur.  However, many patients feel a deep cramping in the muscle in which the needle was inserted. This is the local twitch response.    How Will I Feel After The Treatment?   1. Soreness is normal, and the  onset of soreness may not occur for a few hours.  Typically this soreness does not last longer than two days.   2. Bruising is uncommon, however; ice can be used to decrease any possible bruising.   3. In rare cases feeling tired or nauseous after the treatment is normal.  In addition, your symptoms may get worse before they get better, this period will typically not last longer than 24 hours.   What Can I do After My Treatment?   1.  Increase your hydration by drinking more water for the next 24 hours.   2.  You may place ice or heat on the areas treated that have  become sore, however don not use heat on inflamed or bruised areas.  Heat often brings more relief post needling.   3. You can continue your regular activities, but vigorous activity is not recommended initially after the treatment for 24 hours.   4. DN is best combined with other physical therapy such as strengthening, stretching, and other therapies.    Thank you for coming in today. I hope you feel we met your needs.  Feel free to call PCP if you have any questions or further requests.  Please consider signing up for MyChart if you do not already have it, as this is a great way to communicate with me.  Best,  Whitney McVey, PA-C  IF you received an x-ray today, you will receive an invoice from [facility]. Please contact [facility] at [phone] with questions or concerns regarding your invoice.   IF you received labwork today, you will receive an invoice from [facility]. Please contact [facility] at [phone] with questions or concerns regarding your invoice.   Our billing staff will not be able to assist you with questions regarding bills from these companies.  You will be contacted with the lab results as soon as they are available. The fastest way to get your results is to activate your My Chart account. Instructions are located on the last page of this paperwork. If you have not heard from [place] regarding the results in 2 weeks, please contact this office.

## 2017-07-31 ENCOUNTER — Encounter: Payer: Self-pay | Admitting: Physician Assistant

## 2017-07-31 ENCOUNTER — Other Ambulatory Visit: Payer: Self-pay | Admitting: Physician Assistant

## 2017-07-31 DIAGNOSIS — D509 Iron deficiency anemia, unspecified: Secondary | ICD-10-CM

## 2017-08-05 ENCOUNTER — Other Ambulatory Visit: Payer: Self-pay

## 2017-08-05 ENCOUNTER — Encounter: Payer: Self-pay | Admitting: Physician Assistant

## 2017-08-05 ENCOUNTER — Ambulatory Visit: Payer: BC Managed Care – PPO | Admitting: Physician Assistant

## 2017-08-05 VITALS — BP 112/66 | HR 95 | Temp 98.9°F | Resp 16 | Ht 67.0 in | Wt 146.8 lb

## 2017-08-05 DIAGNOSIS — M545 Low back pain, unspecified: Secondary | ICD-10-CM

## 2017-08-05 DIAGNOSIS — D649 Anemia, unspecified: Secondary | ICD-10-CM | POA: Diagnosis not present

## 2017-08-05 LAB — POCT CBC
Granulocyte percent: 60.7 %G (ref 37–80)
HCT, POC: 28 % — AB (ref 37.7–47.9)
Hemoglobin: 8.5 g/dL — AB (ref 12.2–16.2)
Lymph, poc: 2.2 (ref 0.6–3.4)
MCH, POC: 23 pg — AB (ref 27–31.2)
MCHC: 30.5 g/dL — AB (ref 31.8–35.4)
MCV: 75.5 fL — AB (ref 80–97)
MID (cbc): 0.4 (ref 0–0.9)
MPV: 7.1 fL (ref 0–99.8)
POC Granulocyte: 4 (ref 2–6.9)
POC LYMPH PERCENT: 32.9 % (ref 10–50)
POC MID %: 6.4 % (ref 0–12)
Platelet Count, POC: 366 10*3/uL (ref 142–424)
RBC: 3.71 M/uL — AB (ref 4.04–5.48)
RDW, POC: 21.7 %
WBC: 6.6 10*3/uL (ref 4.6–10.2)

## 2017-08-05 NOTE — Progress Notes (Signed)
   Jacqueline Mueller  MRN: [id number] DOB: February 22, 1996  PCP: Sebastian Ache, PA-C  Subjective:  Pt is a pleasant 22 year old female who presents to clinic for several complaints.   1) Right lower back pain x > 1 year. C/o "sciatic back pain" Radiates down right leg sometimes. She has had a steroid injection in the past. She has been to physical therapy - this helped some  2) Anemia - started Ferrous sulfate 324 mg every other day. Started on 4/15. Prior to this she took iron supplements in 2016.  She has h/o heavy menstrual periods - uses super plus tampons q 2-3 hours x 2 days.   Review of Systems  Gastrointestinal: Negative for abdominal pain, nausea and vomiting.  Genitourinary: Negative for dysuria, flank pain, frequency and urgency.  Musculoskeletal: Positive for back pain (low right). Negative for neck pain and neck stiffness.  Neurological: Negative for dizziness, light-headedness and headaches.    There are no active problems to display for this patient.   Current Outpatient Medications on File Prior to Visit  Medication Sig Dispense Refill  . ferrous sulfate 324 (65 Fe) MG TBEC Take 1 tablet (325 mg total) by mouth every other day. 30 tablet 2  . levonorgestrel-ethinyl estradiol (AVIANE) 0.1-20 MG-MCG tablet Take 1 tablet by mouth daily. (Patient not taking: Reported on 08/05/2017) 1 Package 11   No current facility-administered medications on file prior to visit.     No Known Allergies   Objective:  BP 112/66 (BP Location: Left Arm, Patient Position: Sitting, Cuff Size: Normal)   Pulse 95   Temp 98.9 F (37.2 C) (Oral)   Resp 16   Ht  (1.702 m)   Wt 146 lb 12.8 oz (66.6 kg)   LMP 07/18/2017   SpO2 99%   BMI 22.99 kg/m   Physical Exam  Constitutional: She is oriented to person, place, and time. No distress.  Cardiovascular: Normal rate, regular rhythm and normal heart sounds.  Musculoskeletal:       Lumbar back: She exhibits tenderness. She exhibits  normal range of motion and no bony tenderness.       Back:  Neurological: She is alert and oriented to person, place, and time.  Skin: Skin is warm and dry.  Psychiatric: Judgment normal.  Vitals reviewed.  A trigger point/dry needling session was performed at the site of maximal tenderness. This was well tolerated, and followed by moderate relief of pain.  Assessment and Plan :  1. Anemia, unspecified type - Pt has h/o anemia likely 2/2 heavy periods. Started ocps a few weeks ago. Will check labs today and recheck with pt in 3 months, repeat labs.   - POCT CBC - Pathologist smear review; Future - Iron, TIBC and Ferritin Panel; Future - CBC with Differential/Platelet; Future - Reticulocytes; Future  2. Right-sided low back pain without sciatica, unspecified chronicity - Dry needling session performed with moderate improvement of symptoms. Advised heat, stretching, and massage. RTC PRN.    Marco Collie, PA-C  Primary Care at [facility] 08/05/2017 4:28 PM

## 2017-08-05 NOTE — Patient Instructions (Addendum)
Your blood counts are not improving yet. I would like to follow this closely. Come back next week for LAB ONLY to follow-up on your complete blood count.  Continue taking your iron supplement.    Come back if you would like another dry needling session.   Thank you for coming in today. I hope you feel we met your needs.  Feel free to call PCP if you have any questions or further requests.  Please consider signing up for MyChart if you do not already have it, as this is a great way to communicate with me.  Best,  Whitney McVey, PA-C  IF you received an x-ray today, you will receive an invoice from [facility]. Please contact [facility] at [phone] with questions or concerns regarding your invoice.   IF you received labwork today, you will receive an invoice from [facility]. Please contact [facility] at [phone] with questions or concerns regarding your invoice.   Our billing staff will not be able to assist you with questions regarding bills from these companies.  You will be contacted with the lab results as soon as they are available. The fastest way to get your results is to activate your My Chart account. Instructions are located on the last page of this paperwork. If you have not heard from [place] regarding the results in 2 weeks, please contact this office.

## 2017-10-31 ENCOUNTER — Ambulatory Visit: Payer: BC Managed Care – PPO | Admitting: Physician Assistant

## 2018-08-08 ENCOUNTER — Telehealth: Payer: BC Managed Care – PPO | Admitting: Physician Assistant

## 2018-08-08 DIAGNOSIS — R509 Fever, unspecified: Secondary | ICD-10-CM

## 2018-08-08 NOTE — Progress Notes (Signed)
E-Visit for [employer] Virus Screening  Hi Rhondalyn. I agree with the 72 hour symptom free recommendation made by employee health.  I have enrolled you in the COVID home monitoring program so that you can track your symptoms. Based on your current symptoms, you may very well have the virus, however your symptoms are mild. Currently, not all patients are being tested. If the symptoms are mild performing the test is not indicated.  You have been enrolled in MyChart Home Monitoring for COVID-19. Daily you will receive a questionnaire within the MyChart website. Our COVID-19 response team will be monitoring your responses daily.   Coronavirus disease 2019 (COVID-19)is a respiratory illness that can spread from person to person. The virus that causes COVID-19 is a new virus that was first identified in the country of [place] but is now found in multiple other countries and has spread to the [place].  Symptoms associated with the virus are mild to severe fever, cough, and shortness of breath. There is currently no vaccine to protect against COVID-19, and there is no specific antiviral treatment for the virus.   It is vitally important that if you feel that you have an infection such as this virus or any other virus that you stay home and away from places where you may spread it to others.  You should self-quarantine for 14 days if you have symptoms that could potentially be coronavirus and avoid contact with people age 65 and older.     You may take acetaminophen (Tylenol) 1000 mg every 8 hours as needed for fever and myalgia.  Please avoid NSAIDS.     Reduce your risk of any infection by using the same precautions used for avoiding the common cold or flu: Wash your hands often with soap and warm water for at least 20 seconds.  If soap and water are not readily available, use an alcohol-based hand sanitizer with at least 60% alcohol.  If coughing or sneezing, cover your mouth and nose by coughing or  sneezing into the elbow areas of your shirt or coat, into a tissue or into your sleeve (not your hands). Avoid shaking hands with others and consider head nods or verbal greetings only.  Avoid touching your eyes,nose, or mouth with unwashed hands. Avoid close contact with people who are sick. Avoid places or events with large numbers of people in one location, like concerts or sporting events. Carefully consider travel plans you have or are making. If you are planning any travel outside or inside the [place], visit the CDC'sTravelers' Health webpagefor the latest health notices. If you have some symptoms but not all symptoms, continue to monitor at home and seek medical attention if your symptoms worsen. If you are having a medical emergency, call 911.  HOME CARE Only take medications as instructed by your medical team. Drink plenty of fluids and get plenty of rest. A steam or ultrasonic humidifier can help if you have congestion.   GET HELP RIGHT AWAY IF: You develop worsening fever. You become short of breath You cough up blood. Your symptoms become more severe MAKE SURE YOU  Understand these instructions. Will watch your condition. Will get help right away if you are not doing well or get worse.  Your e-visit answers were reviewed by a board certified advanced clinical practitioner to complete your personal care plan.  Depending on the condition, your plan could have included both over the counter or prescription medications.  If there is a problem please reply  once you have received a response from your provider. Your safety is important to us.  If you have drug allergies check your prescription carefully.    You can use MyChart to ask questions about today's visit, request a non-urgent call back, or ask for a work or school excuse for 24 hours related to this e-Visit. If it has been greater than 24 hours you will need to follow up with your provider, or enter a new e-Visit to address  those concerns. You will get an e-mail in the next two days asking about your experience.  I hope that your e-visit has been valuable and will speed your recovery. Thank you for using e-visits.    ===View-only below this line===   ----- Message -----    From: Patrica DuelKayla Lainez    Sent: 08/08/2018  9:09 AM EDT      To: E-Visit Mailing List Subject: E-Visit Submission: CoronaVirus (COVID-19) Screening  E-Visit Submission: CoronaVirus (COVID-19) Screening --------------------------------  Question: Do you have any of the following?  Answer:   Fever  Question: Do you have any of the following additional symptoms?  Answer:   Sore throat            Stuffy nose  Question: Have you had a fever? Answer:   Yes  Question: If you are running a fever, please type in your temperature reading Answer:   100.1  Question: How long have you had the fever? Answer:   For a few days  Question: Have others in your home or workplace had similar symptoms? Answer:   No  Question: When did your symptoms start? Answer:   5/3 sore throat and swollen lymph nodes. Fever 100.1 on 5/7, no fever since  Question: Have you recently visited any of the following countries? Answer:   None of these  Question: If you have traveled anywhere in the last  2 months please document where you have visited: Answer:     Question: Have you recently been around others from these countries or visited these countries who have had coughing or fever? Answer:   No  Question: Have you recently been around anyone who has been diagnosed with Corona virus? Answer:   No  Question: Have you been taking any medications? Answer:   No  Question: If taking medications for these symptoms, please list the names and whether they are helping or not Answer:     Question: Are you treated for any of the following conditions: Asthma, COPD, Diabetes, Renal Failure (on Dialysis), AIDS, any Neuromuscular disease that effects the clearing of  secretions, Heart Failure, or Heart Disease? Answer:   No  Question: Please enter a phone number where you can be reached if we have additional questions about your symptoms Answer:   [phone]  Question: Please list your medication allergies that you may have ? 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 I had allergy symptoms and was tested for COVID on 4/29, resulted negative 5/1. On sunday 5/3, I started having swollen lymph nodes and a sore throat with allergy symptoms. I was tested again on Tuesday and resulted negative on 5/7. Thursday night I spiked a fever of 100.1 (had been 98.8-99.5 prior). I haven't had a fever since thursday night but was told by Health at work to do an e-visit and stay out  of work for 72 hrs until fever free.  Question: Are you pregnant? Answer:   I am confident that I am not pregnant  Question: Are you breastfeeding? Answer:   No  A total of 5-10 minutes was spent evaluating this patients questionnaire and formulating a plan of care.

## 2018-12-29 IMAGING — DX DG CHEST 2V
2 series · 2 of 2 positions shown · non-contrast
Comparison: None.

CLINICAL DATA: 21-year-old female with expiratory wheezes. Flu like
symptoms for 2 days.

EXAM:
CHEST  2 VIEW

[chest pa]
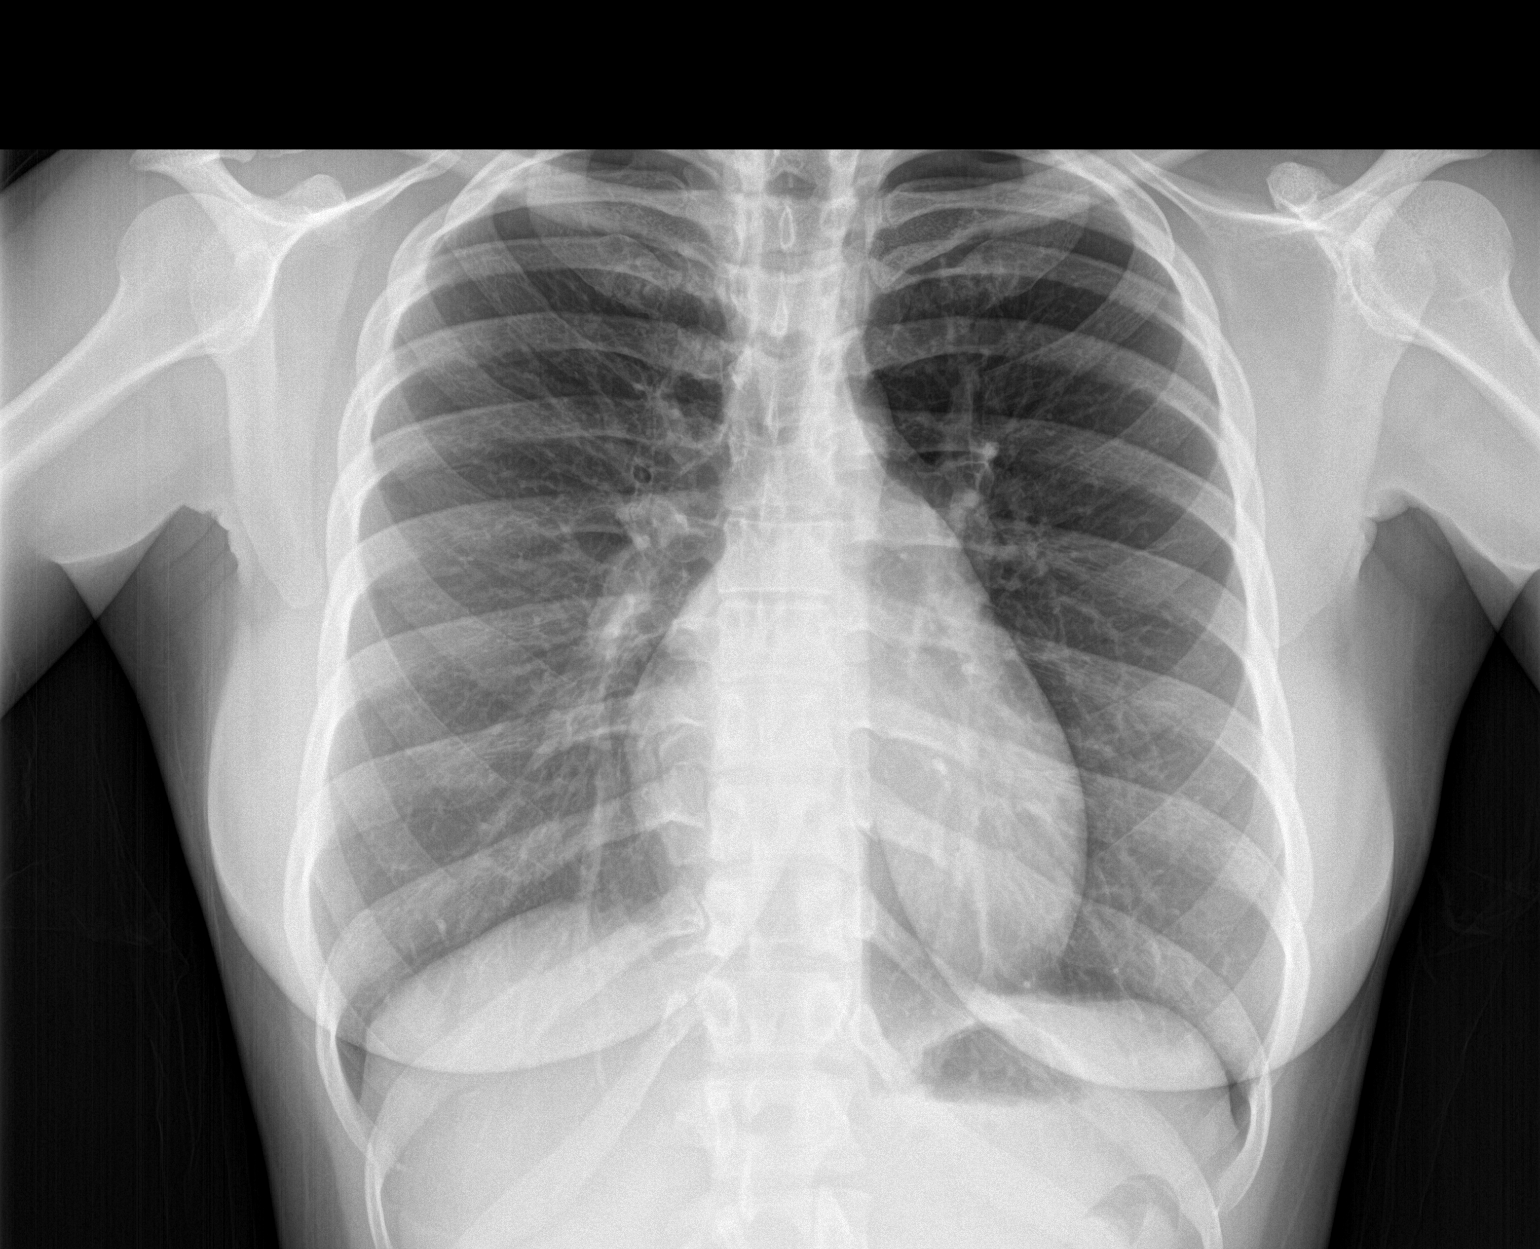

[chest lat]
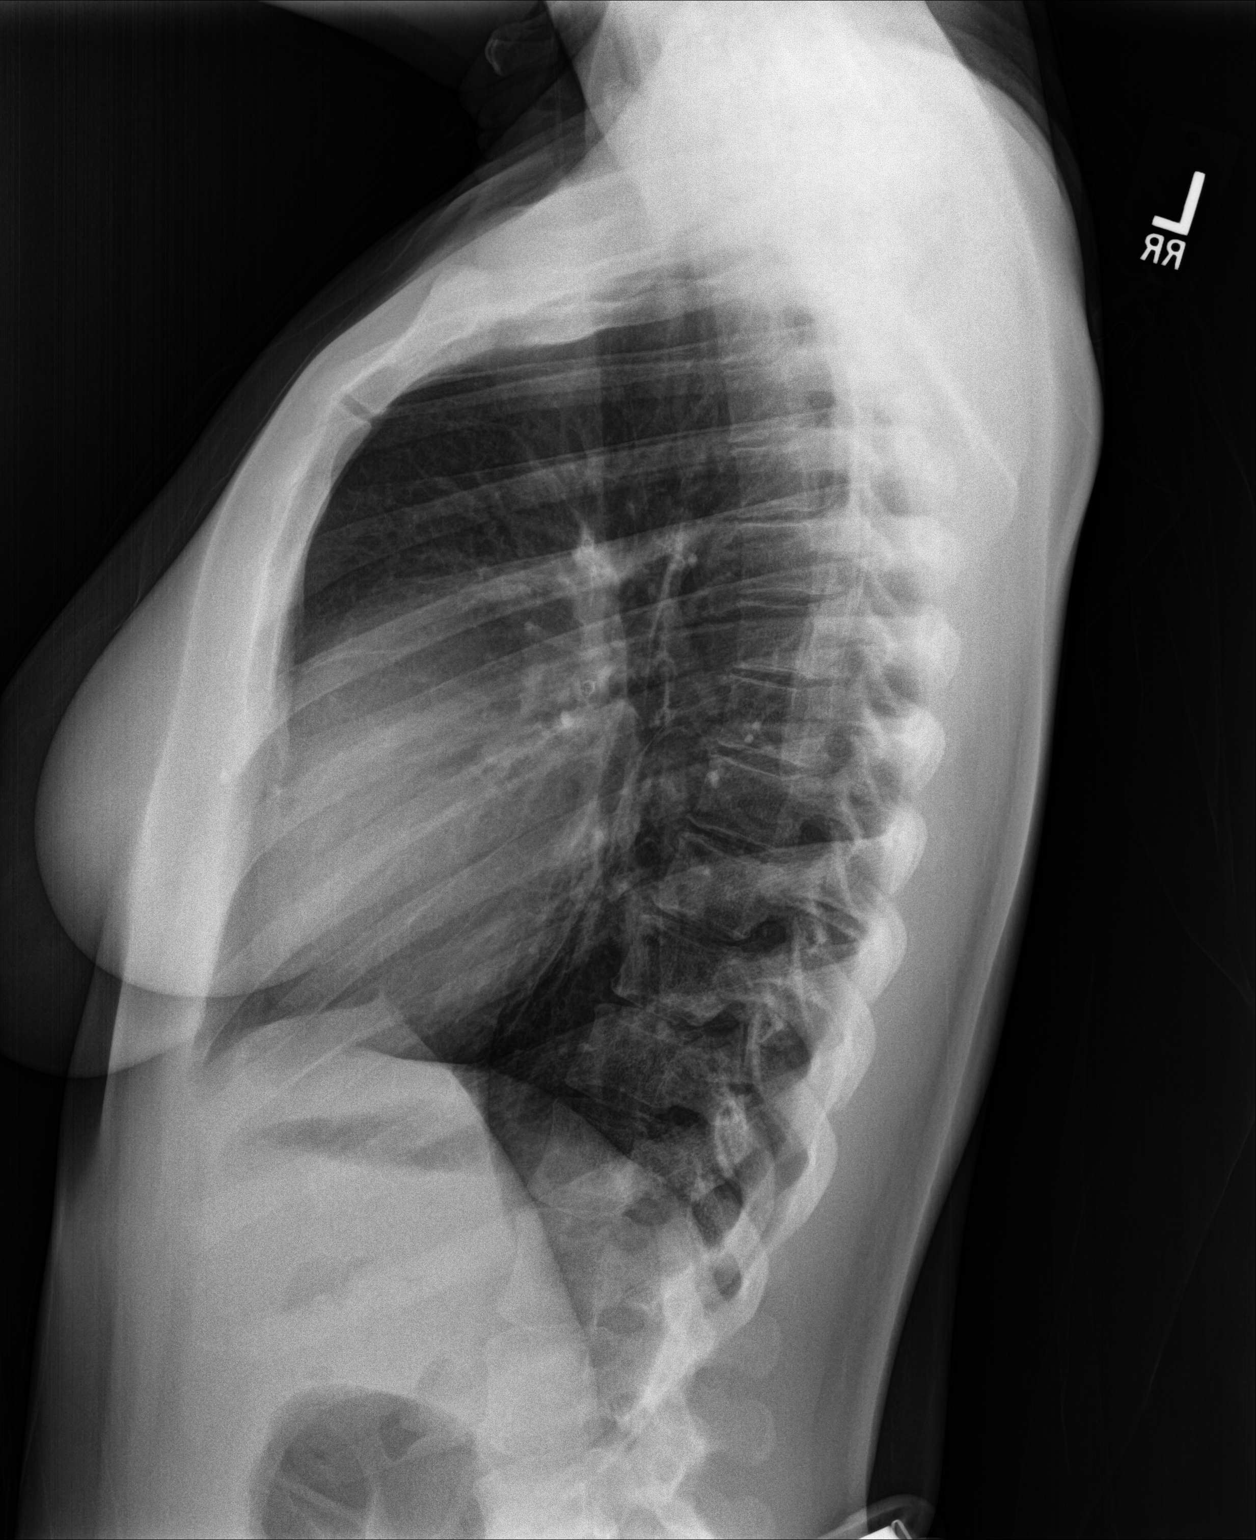

[2 of 2 positions shown; findings below may reference images not displayed]

FINDINGS: Lung volumes are compatible with good inspiratory effort.
Mediastinal contours are within normal limits. Visualized tracheal
air column is within normal limits. No pneumothorax, pulmonary
edema, pleural effusion or abnormal pulmonary opacity. No osseous
abnormality identified. Negative visible bowel gas pattern.
IMPRESSION: Negative.  No acute cardiopulmonary abnormality.

## 2019-02-16 IMAGING — DX DG CHEST 2V
2 series · 2 of 2 positions shown · non-contrast
Comparison: May 26, 2017

CLINICAL DATA: Wheezing and decreased breath sounds in the bases

EXAM:
CHEST - 2 VIEW

[chest pa]
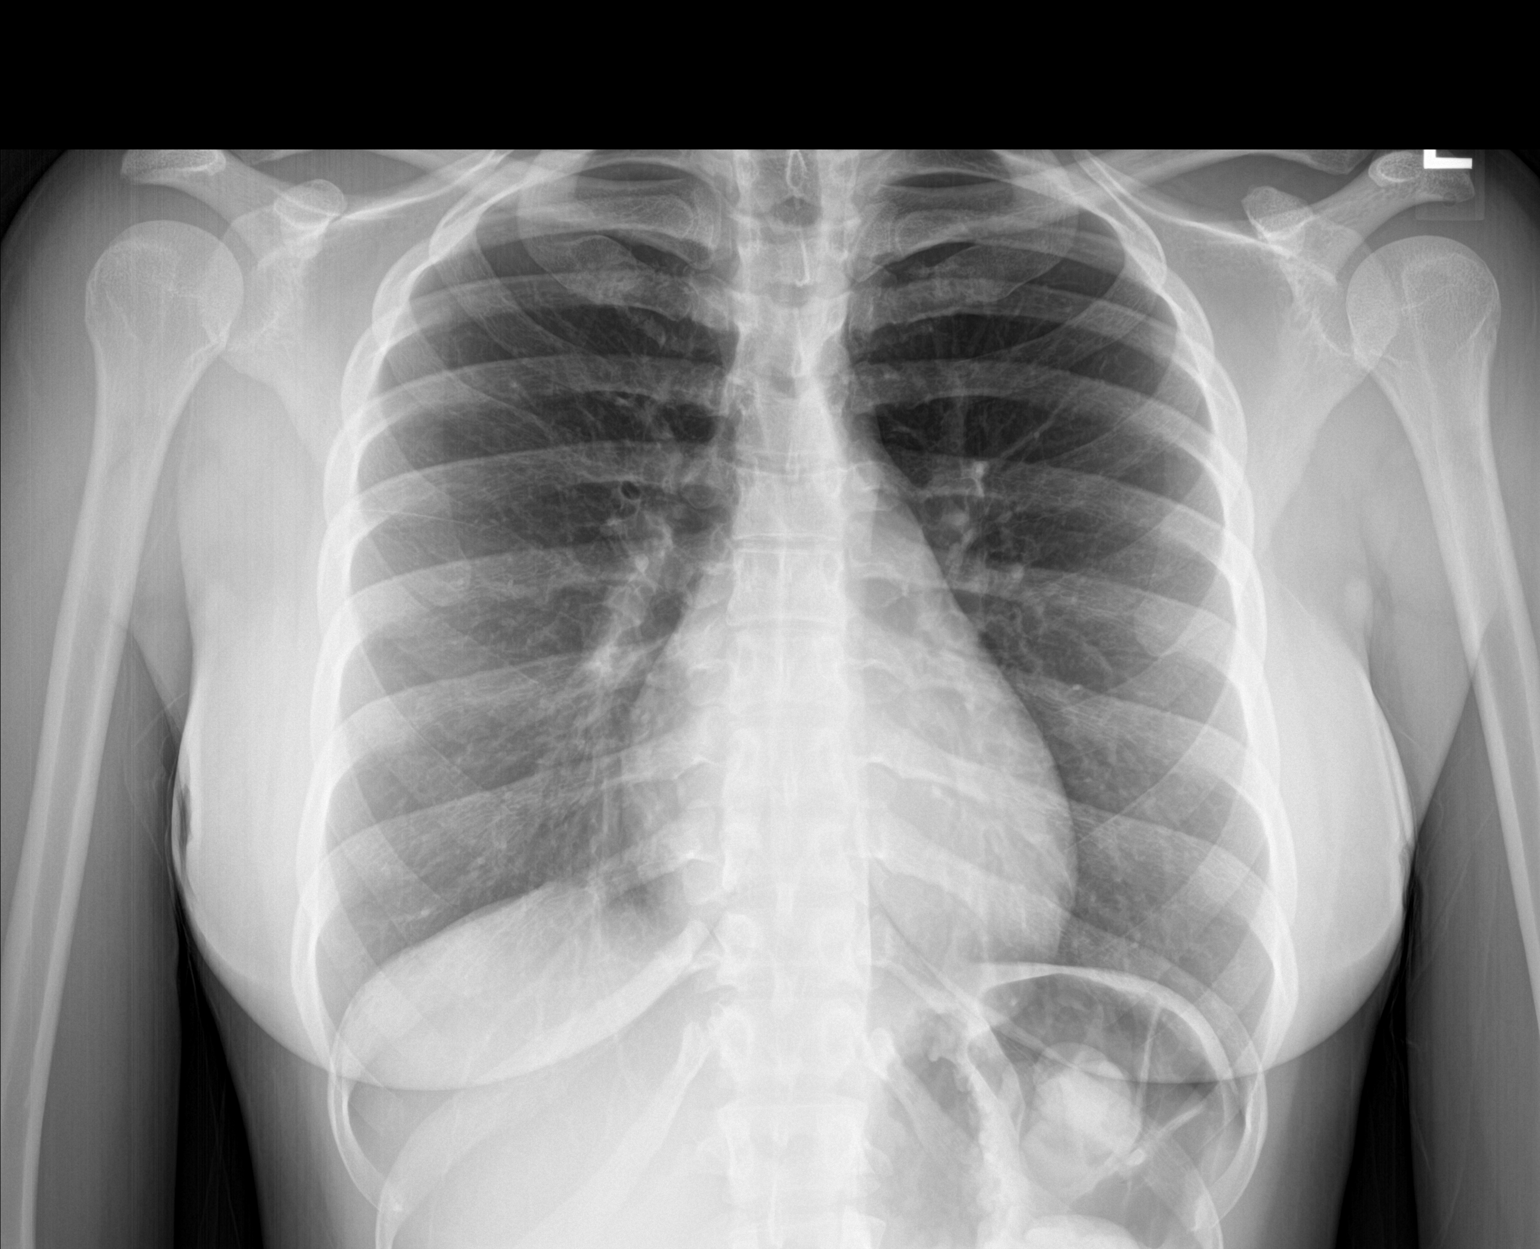

[chest lat]
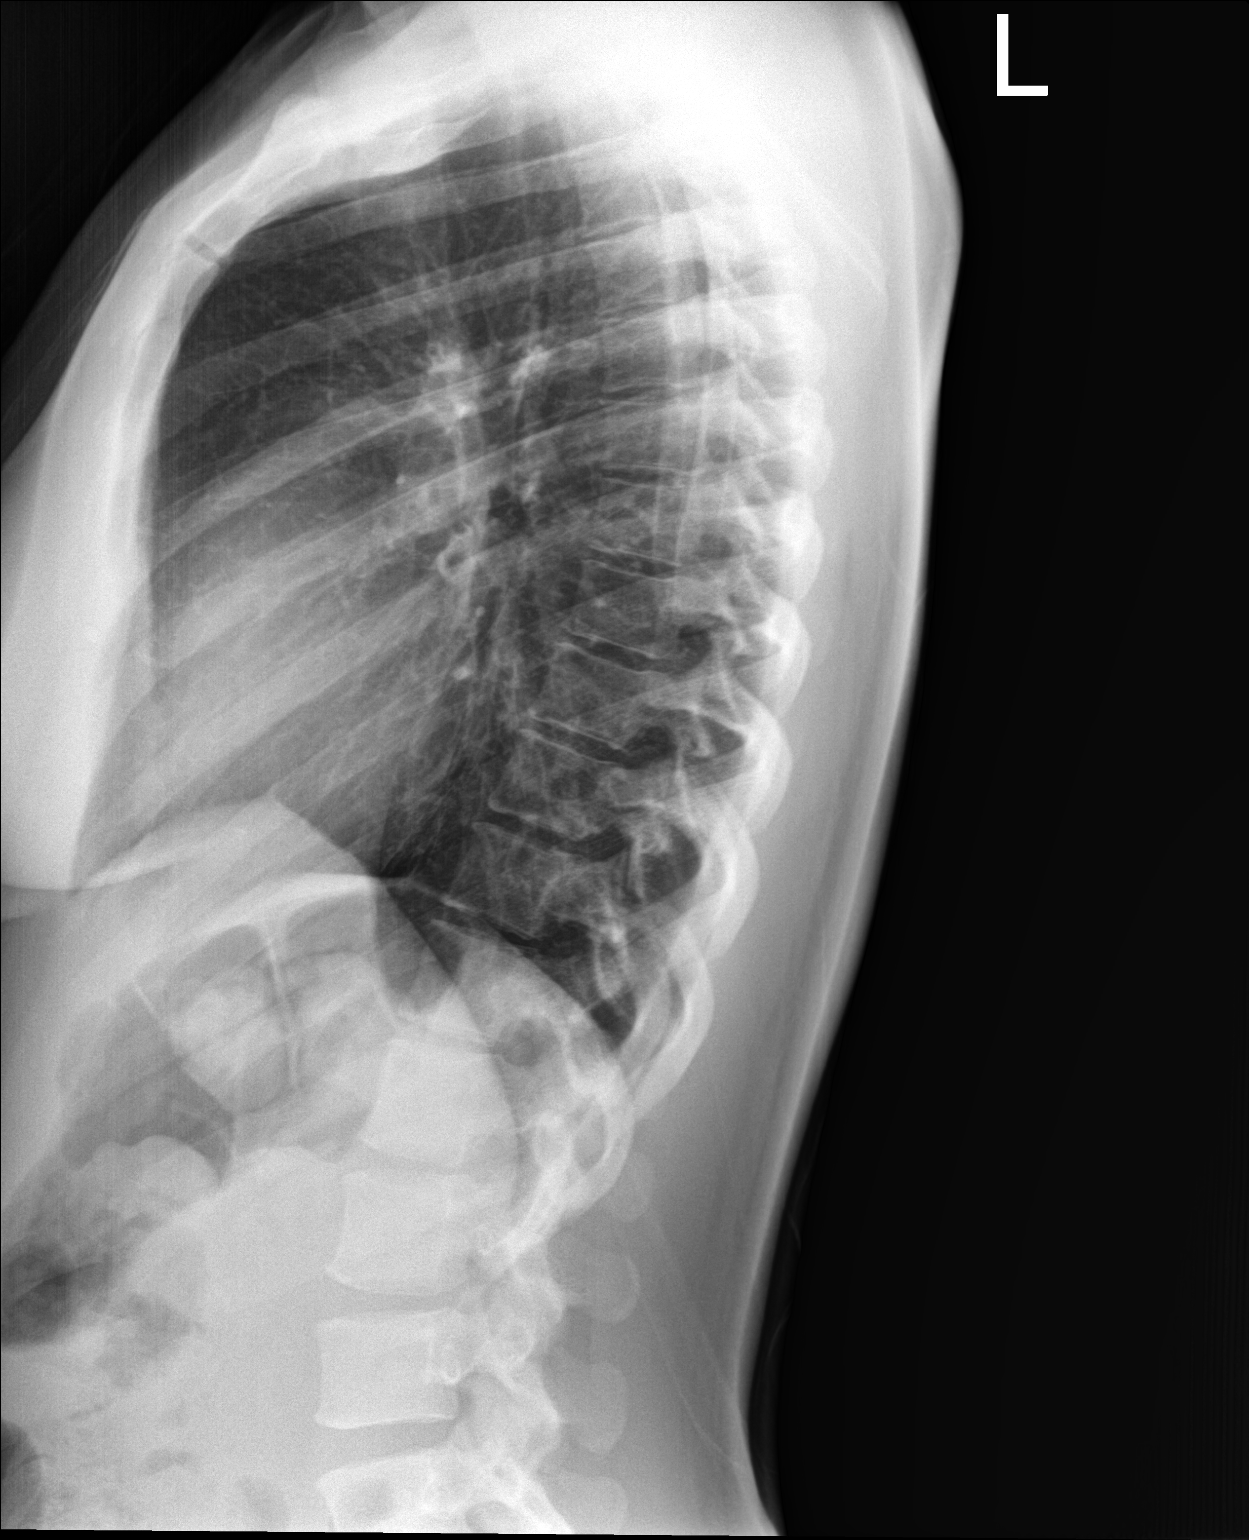

[2 of 2 positions shown; findings below may reference images not displayed]

FINDINGS: The lungs are clear. The heart size and pulmonary vascularity are
normal. No adenopathy. No pneumothorax. No bone lesions.
IMPRESSION: No edema or consolidation.

## 2021-02-09 ENCOUNTER — Ambulatory Visit: Payer: Self-pay

## 2021-04-12 ENCOUNTER — Telehealth: Payer: Self-pay

## 2021-04-12 NOTE — Telephone Encounter (Signed)
Attempted to reach patient to offer 2nd Jynneos vaccination. Patient states she will return office phone call to schedule. Valarie Cones
